# Patient Record
Sex: Male | Born: 2000 | Race: Black or African American | Hispanic: No | Marital: Single
Health system: Southern US, Community
[De-identification: ages and names within clinical notes are randomized; demographics above are authoritative.]

## PROBLEM LIST (undated history)

## (undated) DIAGNOSIS — J45909 Unspecified asthma, uncomplicated: Secondary | ICD-10-CM

---
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
## 2019-09-10 ENCOUNTER — Ambulatory Visit (INDEPENDENT_AMBULATORY_CARE_PROVIDER_SITE_OTHER): Payer: Self-pay

## 2019-09-10 ENCOUNTER — Encounter (HOSPITAL_COMMUNITY): Payer: Self-pay

## 2019-09-10 ENCOUNTER — Ambulatory Visit (HOSPITAL_COMMUNITY)
Admission: EM | Admit: 2019-09-10 | Discharge: 2019-09-10 | Disposition: A | Discharge status: Home / Self Care | Payer: Self-pay | Attending: Urgent Care | Admitting: Urgent Care

## 2019-09-10 ENCOUNTER — Other Ambulatory Visit: Payer: Self-pay

## 2019-09-10 DIAGNOSIS — S60921A Unspecified superficial injury of right hand, initial encounter: Secondary | ICD-10-CM

## 2019-09-10 DIAGNOSIS — M79641 Pain in right hand: Secondary | ICD-10-CM

## 2019-09-10 DIAGNOSIS — W19XXXA Unspecified fall, initial encounter: Secondary | ICD-10-CM

## 2019-09-10 DIAGNOSIS — S61411A Laceration without foreign body of right hand, initial encounter: Secondary | ICD-10-CM

## 2019-09-10 DIAGNOSIS — M79651 Pain in right thigh: Secondary | ICD-10-CM

## 2019-09-10 DIAGNOSIS — S71111A Laceration without foreign body, right thigh, initial encounter: Secondary | ICD-10-CM

## 2019-09-10 MED ORDER — TETANUS-DIPHTH-ACELL PERTUSSIS 5-2.5-18.5 LF-MCG/0.5 IM SUSP: 0.5000 mL | Freq: Once | INTRAMUSCULAR | Status: DC

## 2019-09-10 MED ORDER — CEPHALEXIN 500 MG PO CAPS: 500.0000 mg | ORAL_CAPSULE | Freq: Three times a day (TID) | ORAL | 0 refills | Status: DC

## 2019-09-10 MED ORDER — TETANUS-DIPHTH-ACELL PERTUSSIS 5-2.5-18.5 LF-MCG/0.5 IM SUSP
INTRAMUSCULAR | Status: AC
  Filled 2019-09-10: qty 0.5

## 2019-09-10 NOTE — ED Triage Notes (Addendum)
Pt C/O right hand and leg laceration, this happen on Tuesday. Pt states the wound on his leg is deeper then on his hand. Pt also will need a tetanus shot

## 2019-09-10 NOTE — ED Provider Notes (Signed)
[facility]   MRN: [id number] DOB: [birth year]  Subjective:   Ryan Myers is a 19 y.o. male presenting for suffering a fall about a 5 days ago.  Patient states that he did not fall far, was about 3 to 5 feet high.  He tried to catch himself and ended up suffering the laceration on his hand as a result, also landed on his right hand and right leg.  He suffered lacerations to both.  He has tried to keep his wound clean, has applied dressings and kept them on.  No current facility-administered medications for this encounter.  Current Outpatient Medications:  .  albuterol (PROVENTIL HFA;VENTOLIN HFA) 108 (90 Base) MCG/ACT inhaler, Inhale 2 puffs into the lungs every 4 (four) hours as needed for wheezing or shortness of breath., Disp: 1 Inhaler, Rfl: 0 .  ibuprofen (ADVIL,MOTRIN) 600 MG tablet, 1 tablet every 8 hours prn pain, Disp: 30 tablet, Rfl: 1   No Known Allergies  Past Medical History:  Diagnosis Date  . Asthma      History reviewed. No pertinent surgical history.  History reviewed. No pertinent family history.  Social History   Tobacco Use  . Smoking status: Not on file  . Smokeless tobacco: Never Used  Substance Use Topics  . Alcohol use: Not on file  . Drug use: Not on file    Review of Systems  Constitutional: Negative for fever and malaise/fatigue.  HENT: Negative for congestion, ear pain, sinus pain and sore throat.   Eyes: Negative for discharge and redness.  Respiratory: Negative for cough, hemoptysis, shortness of breath and wheezing.   Cardiovascular: Negative for chest pain.  Gastrointestinal: Negative for abdominal pain, diarrhea, nausea and vomiting.  Genitourinary: Negative for dysuria, flank pain and hematuria.  Musculoskeletal: Positive for falls and myalgias. Negative for back pain and neck pain.  Skin: Negative for rash.  Neurological: Negative for dizziness, weakness and headaches.  Psychiatric/Behavioral: Negative for depression and  substance abuse.     Objective:   Vitals: BP 139/82 (BP Location: Left Arm)   Pulse 60   Temp 98.2 F (36.8 C) (Oral)   Resp 18   SpO2 100%   Physical Exam Constitutional:      General: He is not in acute distress.    Appearance: Normal appearance. He is well-developed and normal weight. He is not ill-appearing, toxic-appearing or diaphoretic.  HENT:     Head: Normocephalic and atraumatic.     Right Ear: External ear normal.     Left Ear: External ear normal.     Nose: Nose normal.     Mouth/Throat:     Pharynx: Oropharynx is clear.  Eyes:     General: No scleral icterus.       Right eye: No discharge.        Left eye: No discharge.     Extraocular Movements: Extraocular movements intact.     Pupils: Pupils are equal, round, and reactive to light.  Cardiovascular:     Rate and Rhythm: Normal rate.  Pulmonary:     Effort: Pulmonary effort is normal.  Musculoskeletal:     Cervical back: Normal range of motion.  Skin:    General: Skin is warm and dry.     Findings: No erythema or rash.       Neurological:     Mental Status: He is alert and oriented to person, place, and time.     Cranial Nerves: No cranial nerve deficit.  Motor: No weakness.     Coordination: Coordination normal.     Gait: Gait normal.     Deep Tendon Reflexes: Reflexes normal.  Psychiatric:        Mood and Affect: Mood normal.        Behavior: Behavior normal.        Thought Content: Thought content normal.        Judgment: Judgment normal.       Status post loose approximation of his wound.       DG Hand Complete Right  Result Date: 09/10/2019 CLINICAL DATA:  Fall, laceration, injury EXAM: RIGHT HAND - COMPLETE 3+ VIEW COMPARISON:  None. FINDINGS: Normal alignment without acute osseous finding. No subluxation or dislocation. No joint abnormality. No acute fracture. Overlying gauze noted of the right thumb. No large radiopaque foreign body. IMPRESSION: No acute finding by plain  radiography Electronically Signed   By: Jerilynn Mages.  Shick M.D.   On: 09/10/2019 15:21   PROCEDURE NOTE: laceration repair Verbal consent obtained from patient.  Local anesthesia with 10cc 2% lidocaine without epi for right hand and 5cc 2% lidocaine with epi for right posterior thigh.  Wounds explored for tendon, ligament damage. Wound scrubbed with soap and water and rinsed. Wound loosely approximated with #5 4-0 Prolene (mixture of simple interrupted and horizontal mattress) sutures for right hand and #3 4-0 Prolene (mixture of simple interrupted and horizontal mattress) sutures.  Wound cleansed and dressed.   Assessment and Plan :   1. Laceration of right hand without foreign body, initial encounter   2. Laceration of right thigh, initial encounter   3. Fall, initial encounter   4. Right hand pain   5. Right thigh pain     Lacerations loosely approximated successfully.  Patient does not need Tdap to be updated, had this last year.  His wounds are high risk for infection given age of the wound, will start Keflex given depth of the wound as well.  Patient is to recheck in 2 days.  Counseled patient on potential for adverse effects with medications prescribed/recommended today, ER and return-to-clinic precautions discussed, patient verbalized understanding.   Jaynee Eagles, [place] 09/10/19 1734

## 2019-09-10 NOTE — Discharge Instructions (Signed)
Please change your dressing 2 times daily. Do not apply any ointments or creams. Each time you change your dressing, make sure you clean gently around the perimeter of the wound with gentle soap and warm water. Pat your wound dry and let it air out if possible for 1-2 hours before reapplying another dressing.

## 2019-09-21 ENCOUNTER — Ambulatory Visit (HOSPITAL_COMMUNITY)
Admission: EM | Admit: 2019-09-21 | Discharge: 2019-09-21 | Disposition: A | Discharge status: Home / Self Care | Payer: Self-pay | Attending: Family Medicine | Admitting: Family Medicine

## 2019-09-21 ENCOUNTER — Encounter (HOSPITAL_COMMUNITY): Payer: Self-pay

## 2019-09-21 ENCOUNTER — Other Ambulatory Visit: Payer: Self-pay

## 2019-09-21 DIAGNOSIS — J4541 Moderate persistent asthma with (acute) exacerbation: Secondary | ICD-10-CM

## 2019-09-21 MED ORDER — ALBUTEROL SULFATE HFA 108 (90 BASE) MCG/ACT IN AERS: 1.0000 | INHALATION_SPRAY | Freq: Four times a day (QID) | RESPIRATORY_TRACT | 2 refills | Status: AC | PRN

## 2019-09-21 NOTE — ED Provider Notes (Signed)
[facility]   [id number] 09/21/19 Arrival Time: 7001  ASSESSMENT & PLAN:  1. Moderate persistent asthma with exacerbation     Prefers inhaler only at this time. Rx with two refills.  Meds ordered this encounter  Medications  . albuterol (VENTOLIN HFA) 108 (90 Base) MCG/ACT inhaler    Sig: Inhale 1-2 puffs into the lungs every 6 (six) hours as needed for wheezing or shortness of breath.    Dispense:  18 g    Refill:  2    No indication for CXR. Asthma precautions given. OTC symptom care as needed.  Recommend: Follow-up Information    [facility].   Specialty: Urgent Care Why: If worsening or failing to improve as anticipated. Contact information: [place] [phone]         Prednisone if worsening.  Reviewed expectations re: course of current medical issues. Questions answered. Outlined signs and symptoms indicating need for more acute intervention. Patient verbalized understanding. After Visit Summary given.  SUBJECTIVE: History from: patient.  Ryan Myers is a 19 y.o. male who presents with complaint of persistent chest tightness and wheezing. Onset abrupt, upon waking this morning. Triggers: questions heat/hot air at home. Describes wheezing as moderate when present. Fever: no. Overall normal PO intake without n/v. Sick contacts: no. Ambulatory without difficulty. No LE edema. Typically his asthma is well controlled. Inhaler use: hasn't needed one in quite awhile; requests refill. OTC treatment: none.   Social History   Tobacco Use  Smoking Status Not on file  Smokeless Tobacco Never Used    ROS: As per HPI.   OBJECTIVE:  Vitals:   09/21/19 0956 09/21/19 0957  BP: 130/86   Pulse: 86   Resp: 20   Temp: 97.8 F (36.6 C)   TempSrc: Oral   SpO2: 100% 100%     General appearance: alert; NAD HEENT: [place]; AT; without nasal congestion Neck: supple  without LAD Cv: RRR without murmer Lungs: unlabored respirations, moderate bilateral expiratory wheezing; cough: absent; no significant respiratory distress; is able to speak full sentences Skin: warm and dry Psychological: alert and cooperative; normal mood and affect    No Known Allergies  Past Medical History:  Diagnosis Date  . Asthma     Social History   Socioeconomic History  . Marital status: Single    Spouse name: Not on file  . Number of children: Not on file  . Years of education: Not on file  . Highest education level: Not on file  Occupational History  . Not on file  Tobacco Use  . Smoking status: Not on file  . Smokeless tobacco: Never Used  Substance and Sexual Activity  . Alcohol use: Not on file  . Drug use: Not on file  . Sexual activity: Not on file  Other Topics Concern  . Not on file  Social History Narrative  . Not on file   Social Determinants of Health   Financial Resource Strain:   . Difficulty of Paying Living Expenses: Not on file  Food Insecurity:   . Worried About Charity fundraiser in the Last Year: Not on file  . Ran Out of Food in the Last Year: Not on file  Transportation Needs:   . Lack of Transportation (Medical): Not on file  . Lack of Transportation (Non-Medical): Not on file  Physical Activity:   . Days of Exercise per Week: Not on file  . Minutes of  Exercise per Session: Not on file  Stress:   . Feeling of Stress : Not on file  Social Connections:   . Frequency of Communication with Friends and Family: Not on file  . Frequency of Social Gatherings with Friends and Family: Not on file  . Attends Religious Services: Not on file  . Active Member of Clubs or Organizations: Not on file  . Attends Banker Meetings: Not on file  . Marital Status: Not on file  Intimate Partner Violence:   . Fear of Current or Ex-Partner: Not on file  . Emotionally Abused: Not on file  . Physically Abused: Not on file  .  Sexually Abused: Not on file            Mardella Layman, MD 09/21/19 1007

## 2019-09-21 NOTE — ED Triage Notes (Signed)
Pt c/o sob upon waking this morning. States h/o asthma, out of his asthma inhalers. Respirations normal excursion, able to speak full sentences w/o difficulty, no acute distress; mild wheezes auscultated.

## 2021-04-14 IMAGING — DX DG HAND COMPLETE 3+V*R*
3 series · 3 of 3 positions shown · non-contrast
Comparison: None.

CLINICAL DATA: Fall, laceration, injury

EXAM:
RIGHT HAND - COMPLETE 3+ VIEW

[hand pa]
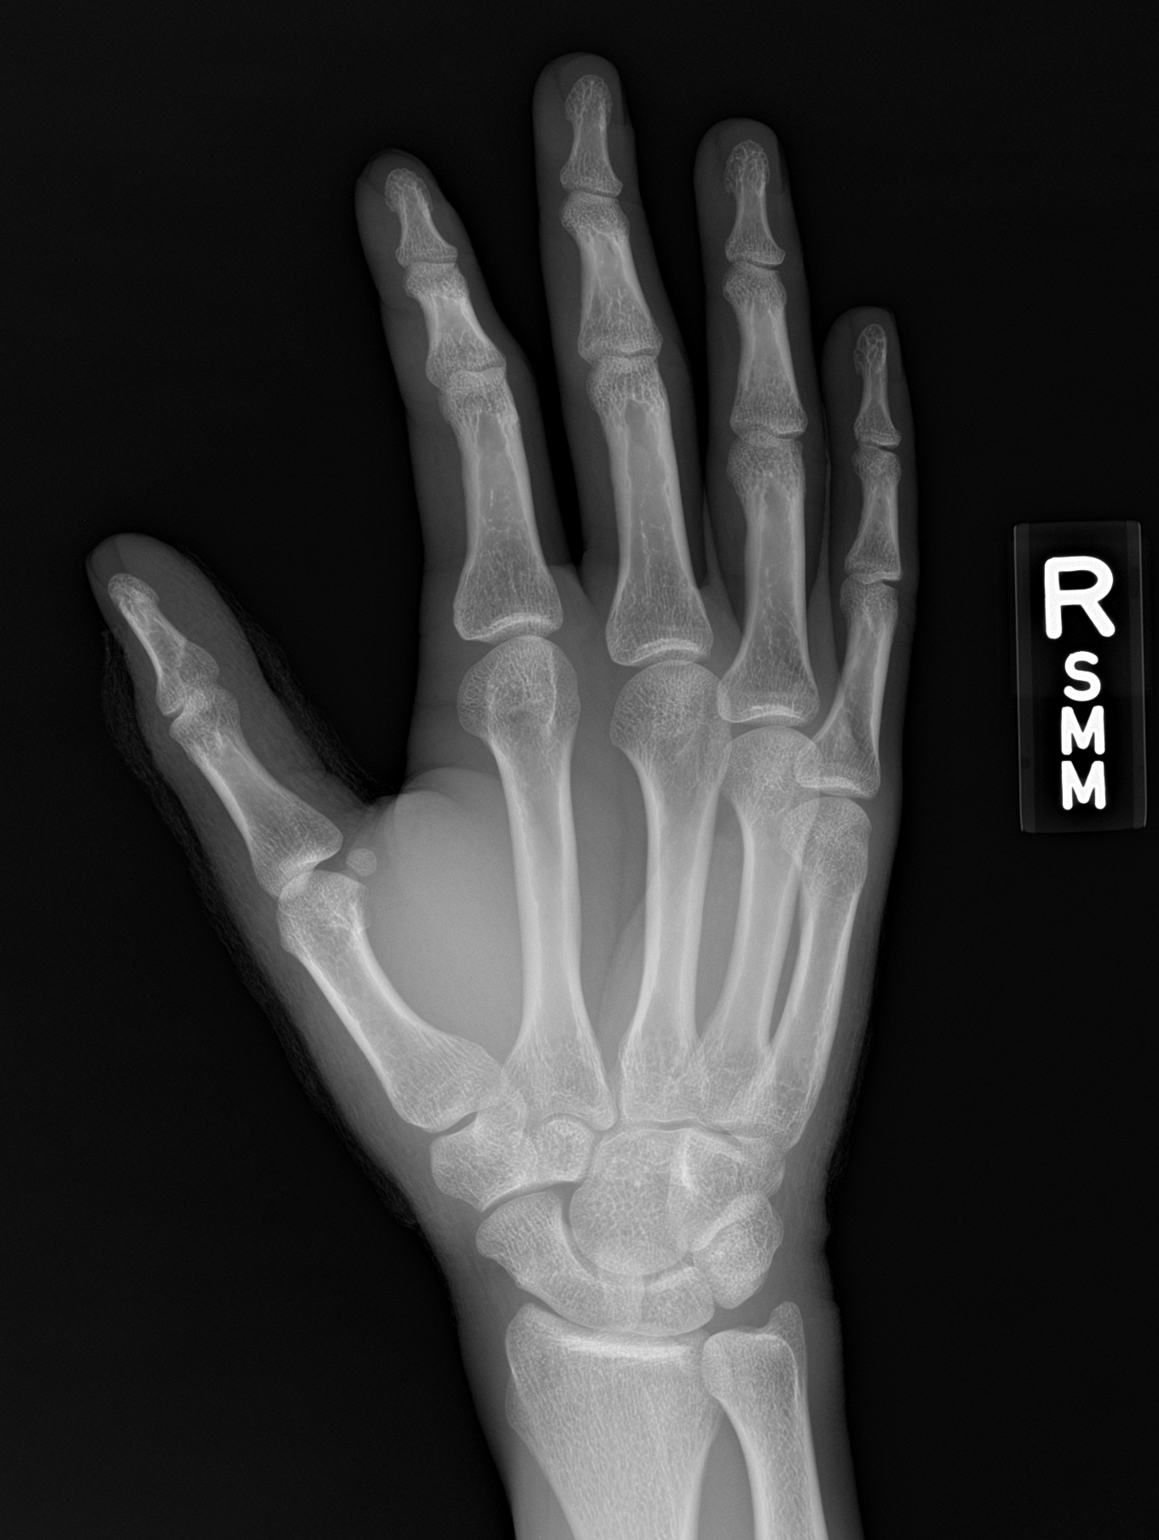

[hand obl]
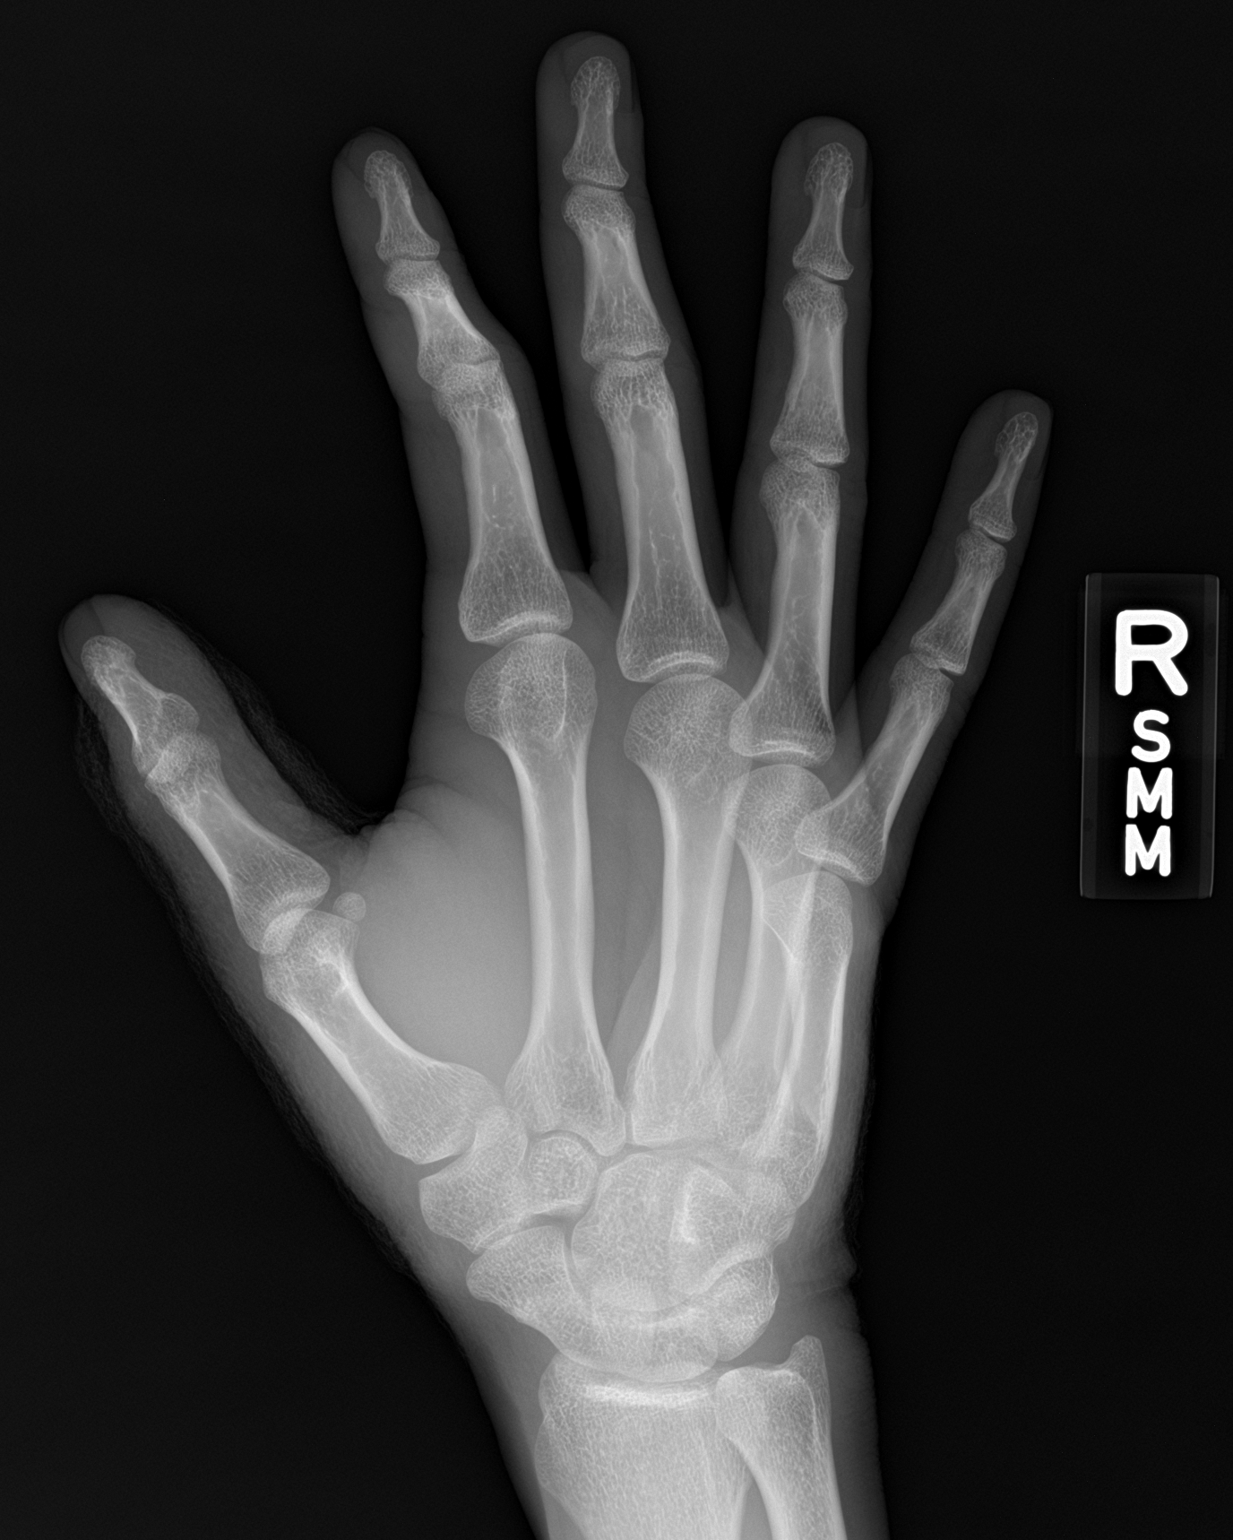

[hand lat]
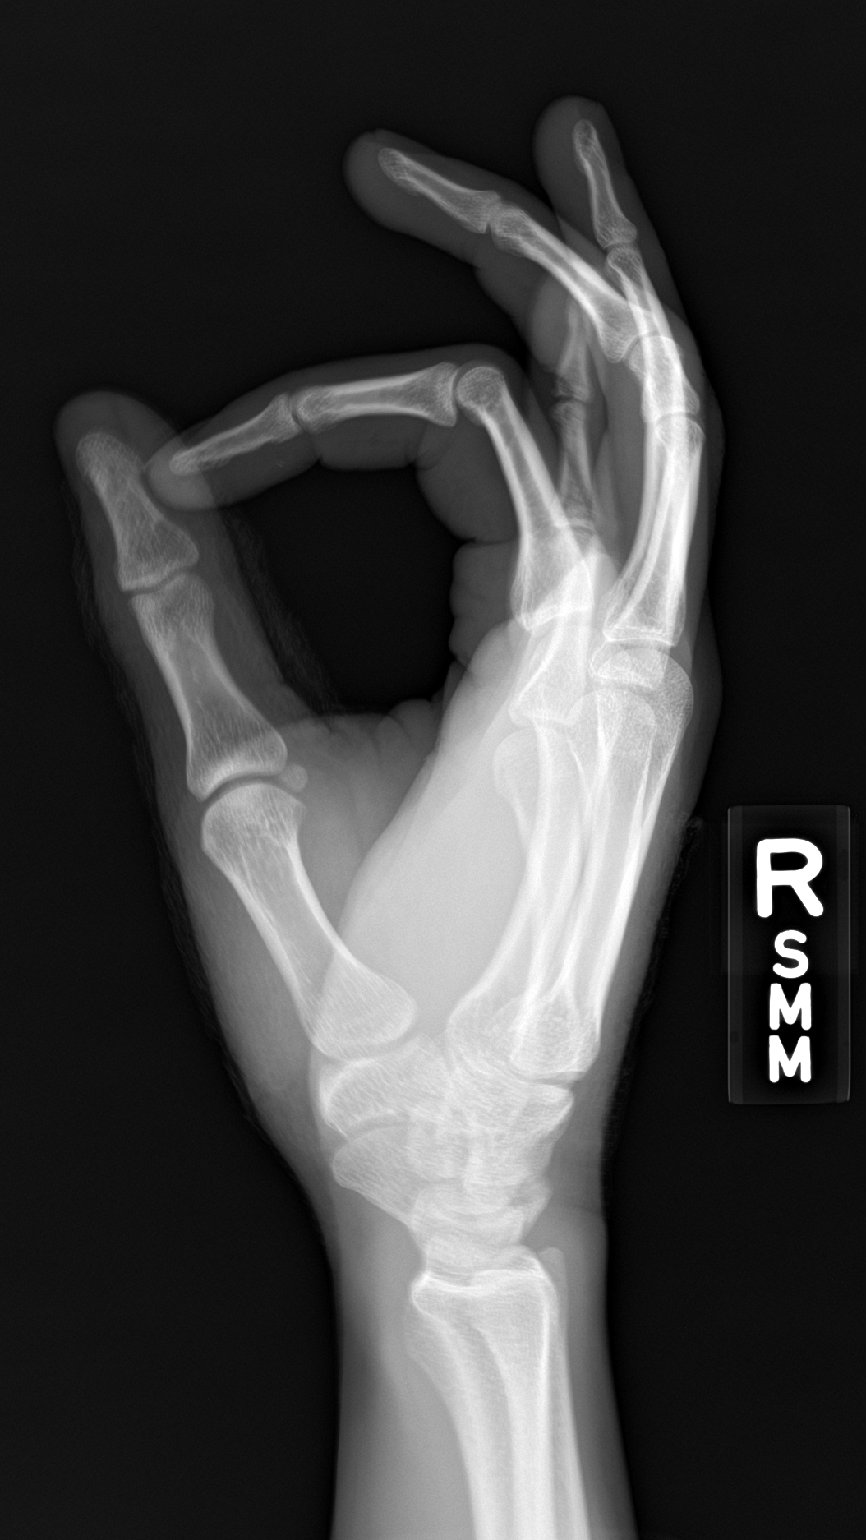

[3 of 3 positions shown; findings below may reference images not displayed]

FINDINGS: Normal alignment without acute osseous finding. No subluxation or
dislocation. No joint abnormality. No acute fracture. Overlying
gauze noted of the right thumb. No large radiopaque foreign body.
IMPRESSION: No acute finding by plain radiography
